# Patient Record
Sex: Female | Born: 1985 | Race: Black or African American | Hispanic: No | Marital: Single | Smoking: Never smoker
Health system: Southern US, Community
[De-identification: ages and names within clinical notes are randomized; demographics above are authoritative.]

## PROBLEM LIST (undated history)

## (undated) HISTORY — PX: WRIST SURGERY: SHX841

---

## 2017-11-30 ENCOUNTER — Encounter: Payer: Self-pay | Admitting: Emergency Medicine

## 2017-11-30 ENCOUNTER — Other Ambulatory Visit: Payer: Self-pay

## 2017-11-30 ENCOUNTER — Ambulatory Visit
Admission: EM | Admit: 2017-11-30 | Discharge: 2017-11-30 | Disposition: A | Discharge status: Home / Self Care | Payer: BLUE CROSS/BLUE SHIELD | Attending: Family Medicine | Admitting: Family Medicine

## 2017-11-30 ENCOUNTER — Ambulatory Visit (INDEPENDENT_AMBULATORY_CARE_PROVIDER_SITE_OTHER): Payer: BLUE CROSS/BLUE SHIELD

## 2017-11-30 DIAGNOSIS — M79644 Pain in right finger(s): Secondary | ICD-10-CM | POA: Diagnosis not present

## 2017-11-30 MED ORDER — MELOXICAM 15 MG PO TABS: 15.0000 mg | ORAL_TABLET | Freq: Every day | ORAL | 0 refills | Status: DC | PRN

## 2017-11-30 NOTE — Discharge Instructions (Addendum)
Take medication as prescribed. Keep in splint.   Follow up with orthopedic this week.   Follow up with your primary care physician this week as needed. Return to Urgent care for new or worsening concerns.

## 2017-11-30 NOTE — ED Provider Notes (Signed)
[facility] ____________________________________________  Time seen: Approximately 7:09 PM  I have reviewed the triage vital signs and the nursing notes.   HISTORY  Chief Complaint Finger Pain (Right 4th finger)  HPI Susan Rodgers is a 32 y.o. female patient for evaluation of right middle finger pain and swelling that she noticed upon awakening this morning.  States last night finger felt completely normal.  Denies any known injury, trauma or potential injury.  States she does sleep with her hand underneath her pillow but again denies injury.  Denies insect bite, skin changes.  Reports healthy person.  Denies chronic medical problems.  Reports right-hand dominant.  Denies history of similar in the past.  Reports up-to-date on immunizations including tetanus.  Denies pain radiation, paresthesias or loss of range of motion.  States the pain is minimal if none, if hand is completely still, states pain is only with fourth finger flexion. Denies recent sickness. Denies recent antibiotic use.   [name], Anita, DO:PCP No LMP recorded. (Menstrual status: IUD).denies pregnancy.   History reviewed. No pertinent past medical history.  There are no active problems to display for this patient.   History reviewed. No pertinent surgical history.   No current facility-administered medications for this encounter.   Current Outpatient Medications:  .  levonorgestrel (MIRENA) 20 MCG/24HR IUD, 1 each by Intrauterine route once., Disp: , Rfl:  .  meloxicam (MOBIC) 15 MG tablet, Take 1 tablet (15 mg total) by mouth daily as needed., Disp: 10 tablet, Rfl: 0  Allergies Patient has no known allergies.  History reviewed. No pertinent family history.  Social History Social History   Tobacco Use  . Smoking status: Never Smoker  . Smokeless tobacco: Never Used  Substance Use Topics  . Alcohol use: Yes  . Drug use: Never    Review of Systems Constitutional: No  fever/chills Cardiovascular: Denies chest pain. Respiratory: Denies shortness of breath. Gastrointestinal: No abdominal pain.   Musculoskeletal: Negative for back pain. As above.  Skin: Negative for rash.  ____________________________________________   PHYSICAL EXAM:  VITAL SIGNS: ED Triage Vitals  Enc Vitals Group     BP 11/30/17 1826 121/74     Pulse Rate 11/30/17 1826 75     Resp 11/30/17 1826 14     Temp 11/30/17 1826 98.6 F (37 C)     Temp Source 11/30/17 1826 Oral     SpO2 11/30/17 1826 100 %     Weight 11/30/17 1823 131 lb (59.4 kg)     Height 11/30/17 1823 5\' 6"  (1.676 m)     Head Circumference --      Peak Flow --      Pain Score 11/30/17 1823 5     Pain Loc --      Pain Edu? --      Excl. in GC? --     Constitutional: Alert and oriented. Well appearing and in no acute distress. ENT      Head: Normocephalic and atraumatic. Cardiovascular: Normal rate, regular rhythm. Grossly normal heart sounds.  Good peripheral circulation. Respiratory: Normal respiratory effort without tachypnea nor retractions. Breath sounds are clear and equal bilaterally. No wheezes, rales, rhonchi. Musculoskeletal:  Steady gait.  Bilateral distal radial pulses equal and easily palpated.  Bilateral hand grip strong and equal.  Except: Right hand medial aspect of the PIP joint mild tenderness to direct palpation with noted soft tissue swelling, no erythema, no break in skin, minimal pain with resisted distal flexion and extension of fourth finger,  but full range of motion present, right hand otherwise nontender, normal distal sensation and capillary refill. Neurologic:  Normal speech and language. Speech is normal. No gait instability.  Skin:  Skin is warm, dry and intact. No rash noted. Psychiatric: Mood and affect are normal. Speech and behavior are normal. Patient exhibits appropriate insight and judgment   ___________________________________________   LABS (all labs ordered are listed,  but only abnormal results are displayed)  Labs Reviewed - No data to display ____________________________________________  Radiology EXAM: RIGHT RING FINGER 2+V  COMPARISON:  None.  FINDINGS: No fracture or malalignment. Joint space within normal limits. Swelling at the PIP joint with amorphous calcification adjacent to the PIP joint.  IMPRESSION: 1. No acute fracture or malalignment 2. Nonspecific periarticular calcification at the PIP joint, possibly dystrophic or due to calcific periarthritis. A small calcified periarticular mass could also be considered but no significant soft tissue component.   Electronically Signed   By: Jasmine Pang M.D.   On: 11/30/2017 19:33   PROCEDURES Procedures   INITIAL IMPRESSION / ASSESSMENT AND PLAN / ED COURSE  Pertinent labs & imaging results that were available during my care of the patient were reviewed by me and considered in my medical decision making (see chart for details).  Well-appearing patient.  No acute distress.  Presenting for evaluation of right fourth finger pain and swelling without known trigger.  Right ring finger x-ray performed with above findings, reviewed by myself.  Called and discussed x-ray with Dr. Deeann Saint orthopedic on-call due to concern for atypical splint, buddy tape, finding.  Dr. Hyacinth Meeker recommended oral daily NSAID and follow-up with them this week.  Information given for Dr. Stephenie Acres per Dr. Rondel Baton recommendation.  Encourage rest, ice and monitoring.Discussed indication, risks and benefits of medications with patient.   Discussed follow up and return parameters including no resolution or any worsening concerns. Patient verbalized understanding and agreed to plan.   ____________________________________________   FINAL CLINICAL IMPRESSION(S) / ED DIAGNOSES  Final diagnoses:  Finger pain, right     ED Discharge Orders        Ordered    meloxicam (MOBIC) 15 MG tablet  Daily PRN      11/30/17 1952       Note: This dictation was prepared with Dragon dictation along with smaller phrase technology. Any transcriptional errors that result from this process are unintentional.         Renford Dills, NP 11/30/17 2028

## 2017-11-30 NOTE — ED Triage Notes (Signed)
Patient states that she woke up this morning with pain and swelling in her right 4th finger.

## 2018-07-07 ENCOUNTER — Encounter: Payer: Self-pay | Admitting: Emergency Medicine

## 2018-07-07 ENCOUNTER — Other Ambulatory Visit: Payer: Self-pay

## 2018-07-07 ENCOUNTER — Ambulatory Visit
Admission: EM | Admit: 2018-07-07 | Discharge: 2018-07-07 | Disposition: A | Discharge status: Home / Self Care | Payer: BLUE CROSS/BLUE SHIELD | Attending: Family Medicine | Admitting: Family Medicine

## 2018-07-07 DIAGNOSIS — M542 Cervicalgia: Secondary | ICD-10-CM | POA: Diagnosis not present

## 2018-07-07 DIAGNOSIS — R51 Headache: Secondary | ICD-10-CM | POA: Insufficient documentation

## 2018-07-07 DIAGNOSIS — R519 Headache, unspecified: Secondary | ICD-10-CM

## 2018-07-07 MED ORDER — DICLOFENAC SODIUM 75 MG PO TBEC: 75.0000 mg | DELAYED_RELEASE_TABLET | Freq: Two times a day (BID) | ORAL | 0 refills | Status: DC | PRN

## 2018-07-07 MED ORDER — TIZANIDINE HCL 4 MG PO TABS: 4.0000 mg | ORAL_TABLET | Freq: Three times a day (TID) | ORAL | 0 refills | Status: DC | PRN
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
## 2018-07-07 NOTE — Discharge Instructions (Signed)
Ice and heat as needed.  Medication as needed.  Take care  Dr. Adriana Simasook
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
## 2020-08-28 ENCOUNTER — Ambulatory Visit: Admit: 2020-08-28 | Discharge status: Against Medical Advice | Payer: 59

## 2020-09-05 ENCOUNTER — Encounter: Payer: Self-pay | Admitting: Emergency Medicine

## 2020-09-05 ENCOUNTER — Emergency Department: Payer: 59

## 2020-09-05 ENCOUNTER — Other Ambulatory Visit: Payer: Self-pay

## 2020-09-05 ENCOUNTER — Emergency Department
Admission: EM | Admit: 2020-09-05 | Discharge: 2020-09-05 | Disposition: A | Discharge status: Home / Self Care | Payer: 59 | Attending: Emergency Medicine | Admitting: Emergency Medicine

## 2020-09-05 DIAGNOSIS — U071 COVID-19: Secondary | ICD-10-CM | POA: Insufficient documentation

## 2020-09-05 DIAGNOSIS — R519 Headache, unspecified: Secondary | ICD-10-CM | POA: Diagnosis present

## 2020-09-05 DIAGNOSIS — Z20822 Contact with and (suspected) exposure to covid-19: Secondary | ICD-10-CM

## 2020-09-05 DIAGNOSIS — Z1152 Encounter for screening for COVID-19: Secondary | ICD-10-CM

## 2020-09-05 LAB — SARS CORONAVIRUS 2 (TAT 6-24 HRS): SARS Coronavirus 2: POSITIVE — AB

## 2020-09-05 NOTE — Discharge Instructions (Signed)
QUARANTINE INSTRUCTION  Follow these instructions at home:  Protecting others To avoid spreading the illness to other people: Quarantine in your home until you have had no cough and fever for 7 days. Household members should also be quarantine for at least 14 days after being exposed to you. Wash your hands often with soap and water. If soap and water are not available, use an alcohol-based hand sanitizer. If you have not cleaned your hands, do not touch your face. Make sure that all people in your household wash their hands well and often. Cover your nose and mouth when you cough or sneeze. Throw away used tissues. Stay home if you have any cold-like or flu-like symptoms. General instructions Go to your local pharmacy and buy a pulse oximeter (this is a machine that measures your oxygen). Check your oxygen levels at least 3 times a day. If your oxygen level is 90% or less return to the emergency room immediately Take over-the-counter and prescription medicines only as told by your health care provider. If you need medication for fever take tylenol or ibuprofen Drink enough fluid to keep your urine pale yellow. Rest at home as directed by your health care provider. Do not give aspirin to a child with the flu, because of the association with Reye's syndrome. Do not use tobacco products, including cigarettes, chewing tobacco, and e-cigarettes. If you need help quitting, ask your health care provider. Keep all follow-up visits as told by your health care provider. This is important. How is this prevented? Avoid areas where an outbreak has been reported. Avoid large groups of people. Keep a safe distance from people who are coughing and sneezing. Do not touch your face if you have not cleaned your hands. When you are around people who are sick or might be sick, wear a mask to protect yourself. Contact a health care provider if: You have symptoms of SARS (cough, fever, chest pain, shortness of  breath) that are not getting better at home. You have a fever. If you have difficulty breathing go to your local ER or call 911    [facility]- [facility]  [phone]  

## 2020-09-05 NOTE — ED Triage Notes (Signed)
Pt to ED from home c/o headache that started yesterday, loss of taste today, cough and runny nose since last Wednesday.  Took rapid COVID test last week was negative and PCR test but never received results.  Pt A&Ox4, chest rise even and unlabored, skin WNL and in NAD at this time.

## 2020-09-05 NOTE — ED Provider Notes (Addendum)
[facility] Emergency Department Provider Note  ____________________________________________  Time seen: Approximately 2:46 AM  I have reviewed the triage vital signs and the nursing notes.   HISTORY  Chief Complaint Headache, Cough, and Nasal Congestion   HPI Susan Rodgers is a 35 y.o. female with no significant past medical history who presents for evaluation of COVID-like symptoms.  Patient reports that her symptoms started a week ago with headache, loss of taste, cough, congestion, diarrhea.  She had a rapid COVID test last week which was negative.  No chest pain or shortness of breath, no abdominal pain, no nausea or vomiting. Patient requesting Covid PCR test. Patient is vaccinated  History reviewed. No pertinent past medical history.  There are no problems to display for this patient.   Past Surgical History:  Procedure Laterality Date  . WRIST SURGERY      Prior to Admission medications   Medication Sig Start Date End Date Taking? Authorizing Provider  diclofenac (VOLTAREN) 75 MG EC tablet Take 1 tablet (75 mg total) by mouth 2 (two) times daily as needed. 07/07/18   Tommie Sams, DO  levonorgestrel (MIRENA) 20 MCG/24HR IUD 1 each by Intrauterine route once.    [provider]  tiZANidine (ZANAFLEX) 4 MG tablet Take 1 tablet (4 mg total) by mouth every 8 (eight) hours as needed for muscle spasms. 07/07/18   Tommie Sams, DO    Allergies Patient has no known allergies.  History reviewed. No pertinent family history.  Social History Social History   Tobacco Use  . Smoking status: Never Smoker  . Smokeless tobacco: Never Used  Vaping Use  . Vaping Use: Never used  Substance Use Topics  . Alcohol use: Yes  . Drug use: Never    Review of Systems  Constitutional: Negative for fever. Eyes: Negative for visual changes. ENT: Negative for sore throat. + congestion Neck: No neck pain  Cardiovascular: Negative for chest  pain. Respiratory: Negative for shortness of breath. + cough Gastrointestinal: Negative for abdominal pain, vomiting. + diarrhea. Genitourinary: Negative for dysuria. Musculoskeletal: Negative for back pain. Skin: Negative for rash. Neurological: Negative for  weakness or numbness. + HA Psych: No SI or HI  ____________________________________________   PHYSICAL EXAM:  VITAL SIGNS: ED Triage Vitals  Enc Vitals Group     BP 09/05/20 0132 132/89     Pulse Rate 09/05/20 0132 75     Resp 09/05/20 0132 16     Temp 09/05/20 0132 98.2 F (36.8 C)     Temp Source 09/05/20 0132 Oral     SpO2 09/05/20 0132 98 %     Weight 09/05/20 0133 135 lb (61.2 kg)     Height 09/05/20 0133 5\' 6"  (1.676 m)     Head Circumference --      Peak Flow --      Pain Score 09/05/20 0132 4     Pain Loc --      Pain Edu? --      Excl. in GC? --     Constitutional: Alert and oriented. Well appearing and in no apparent distress. HEENT:      Head: Normocephalic and atraumatic.         Eyes: Conjunctivae are normal. Sclera is non-icteric.       Mouth/Throat: Mucous membranes are moist.       Neck: Supple with no signs of meningismus. Cardiovascular: Regular rate and rhythm. No murmurs, gallops, or rubs. 2+ symmetrical distal pulses are  present in all extremities. No JVD. Respiratory: Normal respiratory effort. Lungs are clear to auscultation bilaterally. No wheezes, crackles, or rhonchi.  Gastrointestinal: Soft, non tender. Musculoskeletal: No edema, cyanosis, or erythema of extremities. Neurologic: Normal speech and language. Face is symmetric. Moving all extremities. No gross focal neurologic deficits are appreciated. Skin: Skin is warm, dry and intact. No rash noted. Psychiatric: Mood and affect are normal. Speech and behavior are normal.  ____________________________________________   LABS (all labs ordered are listed, but only abnormal results are displayed)  Labs Reviewed  SARS CORONAVIRUS 2  (TAT 6-24 HRS)   ____________________________________________  EKG  none  ____________________________________________  RADIOLOGY  I have personally reviewed the images performed during this visit and I agree with the Radiologist's read.  CLINICAL DATA: Cough, headache, loss of taste, initial COVID testing negative, never received subsequent PCR results  EXAM: CHEST - 2 VIEW  COMPARISON: None.  FINDINGS: No consolidation, features of edema, pneumothorax, or effusion. Pulmonary vascularity is normally distributed. The cardiomediastinal contours are unremarkable. No acute osseous or soft tissue abnormality.  IMPRESSION: No acute cardiopulmonary abnormality.   Electronically Signed By: Kreg Shropshire M.D. On: 09/05/2020 01:55   ____________________________________________   PROCEDURES  Procedure(s) performed: None Procedures Critical Care performed:  None ____________________________________________   INITIAL IMPRESSION / ASSESSMENT AND PLAN / ED COURSE  35 y.o. female with no significant past medical history who presents for evaluation of COVID-like symptoms.  Patient is extremely well-appearing in no distress with normal vitals, normal work of breathing, normal sats both at rest and with ambulation.  Chest x-ray visualized by me with no signs of multifocal pneumonia, confirmed by radiology.  Patient with suspected COVID infection.  COVID swab has been sent.  Discussed quarantine, pulse oximeter monitoring at home, symptom relief, and post-COVID follow-up if test is positive.  Discussed my standard return precautions.       _____________________________________________ Please note:  Patient was evaluated in Emergency Department today for the symptoms described in the history of present illness. Patient was evaluated in the context of the global COVID-19 pandemic, which necessitated consideration that the patient might be at risk for infection with the SARS-CoV-2  virus that causes COVID-19. Institutional protocols and algorithms that pertain to the evaluation of patients at risk for COVID-19 are in a state of rapid change based on information released by regulatory bodies including the CDC and federal and state organizations. These policies and algorithms were followed during the patient's care in the ED.  Some ED evaluations and interventions may be delayed as a result of limited staffing during the pandemic.   [place] Controlled Substance Database was reviewed by me. ____________________________________________   FINAL CLINICAL IMPRESSION(S) / ED DIAGNOSES   Final diagnoses:  Encounter for screening for COVID-19  Suspected COVID-19 virus infection      NEW MEDICATIONS STARTED DURING THIS VISIT:  ED Discharge Orders    None       Note:  This document was prepared using Dragon voice recognition software and may include unintentional dictation errors.    Don Perking, [place], MD 09/05/20 9798    Nita Sickle, MD 09/05/20 0300    Nita Sickle, MD 09/05/20 [phone]

## 2021-01-13 ENCOUNTER — Ambulatory Visit
Admission: RE | Admit: 2021-01-13 | Discharge: 2021-01-13 | Disposition: A | Discharge status: Home / Self Care | Payer: 59 | Source: Ambulatory Visit | Attending: Emergency Medicine | Admitting: Emergency Medicine

## 2021-01-13 ENCOUNTER — Other Ambulatory Visit: Payer: Self-pay

## 2021-01-13 VITALS — BP 130/91 | HR 103 | Temp 99.8°F | Resp 18 | Ht 66.0 in | Wt 140.0 lb

## 2021-01-13 DIAGNOSIS — J069 Acute upper respiratory infection, unspecified: Secondary | ICD-10-CM | POA: Diagnosis not present

## 2021-01-13 LAB — RAPID INFLUENZA A&B ANTIGENS
Influenza A (ARMC): NEGATIVE
Influenza B (ARMC): NEGATIVE

## 2021-01-13 MED ORDER — PROMETHAZINE-DM 6.25-15 MG/5ML PO SYRP: 5.0000 mL | ORAL_SOLUTION | Freq: Four times a day (QID) | ORAL | 0 refills | Status: AC | PRN

## 2021-01-13 MED ORDER — IPRATROPIUM BROMIDE 0.06 % NA SOLN: 2.0000 | Freq: Four times a day (QID) | NASAL | 12 refills | Status: DC

## 2021-01-13 MED ORDER — BENZONATATE 100 MG PO CAPS: 200.0000 mg | ORAL_CAPSULE | Freq: Three times a day (TID) | ORAL | 0 refills | Status: DC

## 2021-01-13 NOTE — Discharge Instructions (Addendum)

## 2021-01-13 NOTE — ED Provider Notes (Signed)
[facility]    CSN: [id number] Arrival date & time: 01/13/21  1054      History   Chief Complaint Chief Complaint  Patient presents with  . Cough    HPI Susan Rodgers is a 35 y.o. female.   HPI   35 year old female here for evaluation of cough, nasal congestion, chest congestion, and body aches.  Patient reports that she has had symptoms for the last 2 days.  Her daughter was diagnosed with flu 2 days ago.  Patient reports that she has had a nonproductive cough, scratchy throat, and fatigue in addition to the above symptoms.  She denies fever, ear pain, nasal discharge, shortness breath or wheezing, or GI complaints.  History reviewed. No pertinent past medical history.  There are no problems to display for this patient.   Past Surgical History:  Procedure Laterality Date  . WRIST SURGERY      OB History   No obstetric history on file.      Home Medications    Prior to Admission medications   Medication Sig Start Date End Date Taking? Authorizing Provider  benzonatate (TESSALON) 100 MG capsule Take 2 capsules (200 mg total) by mouth every 8 (eight) hours. 01/13/21  Yes Becky Augusta, NP  ipratropium (ATROVENT) 0.06 % nasal spray Place 2 sprays into both nostrils 4 (four) times daily. 01/13/21  Yes Becky Augusta, NP  levonorgestrel (MIRENA) 20 MCG/24HR IUD 1 each by Intrauterine route once.   Yes [provider]  promethazine-dextromethorphan (PROMETHAZINE-DM) 6.25-15 MG/5ML syrup Take 5 mLs by mouth 4 (four) times daily as needed. 01/13/21  Yes Becky Augusta, NP    Family History History reviewed. No pertinent family history.  Social History Social History   Tobacco Use  . Smoking status: Never Smoker  . Smokeless tobacco: Never Used  Vaping Use  . Vaping Use: Never used  Substance Use Topics  . Alcohol use: Yes  . Drug use: Never     Allergies   Patient has no known allergies.   Review of Systems Review of Systems   Constitutional: Positive for fatigue. Negative for activity change, appetite change and fever.  HENT: Positive for congestion and sore throat. Negative for ear pain and rhinorrhea.   Respiratory: Positive for cough. Negative for shortness of breath and wheezing.   Gastrointestinal: Negative for diarrhea, nausea and vomiting.  Musculoskeletal: Positive for arthralgias and myalgias.  Hematological: Negative.   Psychiatric/Behavioral: Negative.      Physical Exam Triage Vital Signs ED Triage Vitals  Enc Vitals Group     BP 01/13/21 1137 (!) 130/91     Pulse Rate 01/13/21 1137 (!) 103     Resp 01/13/21 1137 18     Temp 01/13/21 1137 99.8 F (37.7 C)     Temp Source 01/13/21 1137 Oral     SpO2 01/13/21 1137 99 %     Weight 01/13/21 1134 140 lb (63.5 kg)     Height 01/13/21 1134 5\' 6"  (1.676 m)     Head Circumference --      Peak Flow --      Pain Score 01/13/21 1134 6     Pain Loc --      Pain Edu? --      Excl. in GC? --    No data found.  Updated Vital Signs BP (!) 130/91 (BP Location: Left Arm)   Pulse (!) 103   Temp 99.8 F (37.7 C) (Oral)   Resp 18   Ht  5\' 6"  (1.676 m)   Wt 140 lb (63.5 kg)   SpO2 99%   BMI 22.60 kg/m   Visual Acuity Right Eye Distance:   Left Eye Distance:   Bilateral Distance:    Right Eye Near:   Left Eye Near:    Bilateral Near:     Physical Exam Vitals and nursing note reviewed.  Constitutional:      General: She is not in acute distress.    Appearance: Normal appearance. She is normal weight. She is not ill-appearing.  HENT:     Head: Normocephalic and atraumatic.     Right Ear: Tympanic membrane, ear canal and external ear normal. There is no impacted cerumen.     Left Ear: Tympanic membrane, ear canal and external ear normal. There is no impacted cerumen.     Nose: Congestion present.     Mouth/Throat:     Mouth: Mucous membranes are moist.     Pharynx: Oropharynx is clear.  Cardiovascular:     Rate and Rhythm: Normal rate  and regular rhythm.     Pulses: Normal pulses.     Heart sounds: Normal heart sounds. No murmur heard. No gallop.   Pulmonary:     Effort: Pulmonary effort is normal.     Breath sounds: Normal breath sounds. No wheezing, rhonchi or rales.  Musculoskeletal:     Cervical back: Normal range of motion and neck supple.  Lymphadenopathy:     Cervical: No cervical adenopathy.  Skin:    General: Skin is warm and dry.     Capillary Refill: Capillary refill takes less than 2 seconds.     Findings: No erythema or rash.  Neurological:     General: No focal deficit present.     Mental Status: She is alert and oriented to person, place, and time.  Psychiatric:        Mood and Affect: Mood normal.        Behavior: Behavior normal.        Thought Content: Thought content normal.        Judgment: Judgment normal.      UC Treatments / Results  Labs (all labs ordered are listed, but only abnormal results are displayed) Labs Reviewed  RAPID INFLUENZA A&B ANTIGENS    EKG   Radiology No results found.  Procedures Procedures (including critical care time)  Medications Ordered in UC Medications - No data to display  Initial Impression / Assessment and Plan / UC Course  I have reviewed the triage vital signs and the nursing notes.  Pertinent labs & imaging results that were available during my care of the patient were reviewed by me and considered in my medical decision making (see chart for details).   Patient is a very pleasant, nontoxic-appearing 35 year old female here for evaluation of respiratory complaints of been going on for last 2 days.  She denies any fever, ear pain or pressure, nasal discharge, shortness breath or wheezing, GI complaints.  Physical exam reveals pearly gray tympanic membranes bilaterally with a normal light reflex.  The left external auditory canal is mildly ceruminous but the right is clear.  Nasal mucosa is erythematous and edematous with minimal thick clear  nasal discharge.  Oropharyngeal exam reveals posterior oropharyngeal erythema and cobblestoning with clear postnasal drip.  No cervical lymphadenopathy appreciated exam.  Cardiopulmonary exam is benign.  Patient's daughter is flu positive so will swab patient for flu.  Patient is not vaccinated.  Patient is negative for influenza  A or B.  Will discharge patient home with a diagnosis of viral URI and treat with ipratropium nasal spray, Tessalon Perles, and Promethazine DM cough syrup.   Final Clinical Impressions(s) / UC Diagnoses   Final diagnoses:  Viral URI with cough     Discharge Instructions     Use the Atrovent nasal spray, 2 squirts in each nostril every 6 hours, as needed for runny nose and postnasal drip.  Use the Tessalon Perles every 8 hours during the day.  Take them with a small sip of water.  They may give you some numbness to the base of your tongue or a metallic taste in your mouth, this is normal.  Use the Promethazine DM cough syrup at bedtime for cough and congestion.  It will make you drowsy so do not take it during the day.  Return for reevaluation or see your primary care provider for any new or worsening symptoms.     ED Prescriptions    Medication Sig Dispense Auth. Provider   ipratropium (ATROVENT) 0.06 % nasal spray Place 2 sprays into both nostrils 4 (four) times daily. 15 mL Becky Augusta, NP   benzonatate (TESSALON) 100 MG capsule Take 2 capsules (200 mg total) by mouth every 8 (eight) hours. 21 capsule Becky Augusta, NP   promethazine-dextromethorphan (PROMETHAZINE-DM) 6.25-15 MG/5ML syrup Take 5 mLs by mouth 4 (four) times daily as needed. 118 mL Becky Augusta, NP     PDMP not reviewed this encounter.   Becky Augusta, NP 01/13/21 1217

## 2021-01-13 NOTE — ED Triage Notes (Signed)
Patient complains of cough, congestion, body aches since Saturday. Patient states that her daughter is positive for the flu.

## 2021-06-04 ENCOUNTER — Telehealth: Payer: 59 | Admitting: Nurse Practitioner

## 2021-06-04 ENCOUNTER — Telehealth: Payer: 59

## 2021-06-04 DIAGNOSIS — R399 Unspecified symptoms and signs involving the genitourinary system: Secondary | ICD-10-CM | POA: Diagnosis not present

## 2021-06-04 MED ORDER — NITROFURANTOIN MONOHYD MACRO 100 MG PO CAPS: 100.0000 mg | ORAL_CAPSULE | Freq: Two times a day (BID) | ORAL | 0 refills | Status: DC

## 2021-06-04 NOTE — Progress Notes (Signed)
Virtual Visit Consent   Susan Rodgers, you are scheduled for a virtual visit with Susan Daphine Deutscher, FNP, a [facility] provider, today.     Just as with appointments in the office, your consent must be obtained to participate.  Your consent will be active for this visit and any virtual visit you may have with one of our providers in the next 365 days.     If you have a MyChart account, a copy of this consent can be sent to you electronically.  All virtual visits are billed to your insurance company just like a traditional visit in the office.    As this is a virtual visit, video technology does not allow for your provider to perform a traditional examination.  This may limit your provider's ability to fully assess your condition.  If your provider identifies any concerns that need to be evaluated in person or the need to arrange testing (such as labs, EKG, etc.), we will make arrangements to do so.     Although advances in technology are sophisticated, we cannot ensure that it will always work on either your end or our end.  If the connection with a video visit is poor, the visit may have to be switched to a telephone visit.  With either a video or telephone visit, we are not always able to ensure that we have a secure connection.     I need to obtain your verbal consent now.   Are you willing to proceed with your visit today? YES   Susan Rodgers has provided verbal consent on 06/04/2021 for a virtual visit (video or telephone).   Susan Daphine Deutscher, FNP   Date: 06/04/2021 9:55 AM   Virtual Visit via Video Note   I, Susan Rodgers, connected with Susan Rodgers ([id number], 1986-04-05) on 06/04/21 at 10:00 AM EDT by a video-enabled telemedicine application and verified that I am speaking with the correct person using two identifiers.  Location: Patient: Virtual Visit Location Patient: Home Provider: Virtual Visit Location Provider: Mobile   I discussed the  limitations of evaluation and management by telemedicine and the availability of in person appointments. The patient expressed understanding and agreed to proceed.    History of Present Illness: Susan Rodgers is a 35 y.o. who identifies as a female who was assigned female at birth, and is being seen today for urine strong.  HPI: Patient states that urine is feeling strong. She has no dysuria. Has some frequency and urgency. Denies abdominal pain or back pain.   Problems: There are no problems to display for this patient.   Allergies: No Known Allergies Medications:  Current Outpatient Medications:    benzonatate (TESSALON) 100 MG capsule, Take 2 capsules (200 mg total) by mouth every 8 (eight) hours., Disp: 21 capsule, Rfl: 0   ipratropium (ATROVENT) 0.06 % nasal spray, Place 2 sprays into both nostrils 4 (four) times daily., Disp: 15 mL, Rfl: 12   levonorgestrel (MIRENA) 20 MCG/24HR IUD, 1 each by Intrauterine route once., Disp: , Rfl:    promethazine-dextromethorphan (PROMETHAZINE-DM) 6.25-15 MG/5ML syrup, Take 5 mLs by mouth 4 (four) times daily as needed., Disp: 118 mL, Rfl: 0  Observations/Objective: Patient is well-developed, well-nourished in no acute distress.  Resting comfortably  at home.  Head is normocephalic, atraumatic.  No labored breathing.  Speech is clear and coherent with logical content.  Patient is alert and oriented at baseline.    Assessment and Plan:  Yarethzy Dino in today with chief complaint of  UTI symptoms  1. UTI symptoms Take medication as prescribe Cotton underwear Take shower not bath Cranberry juice, yogurt Force fluids AZO over the counter X2 days RTO prn  Meds ordered this encounter  Medications   nitrofurantoin, macrocrystal-monohydrate, (MACROBID) 100 MG capsule    Sig: Take 1 capsule (100 mg total) by mouth 2 (two) times daily. 1 po BId    Dispense:  10 capsule    Refill:  0    Order Specific Question:   Supervising Provider     Answer:   Eber Hong [[id number]]    Patient Instructions  Asymptomatic Bacteriuria Asymptomatic bacteriuria is the presence of a large number of bacteria in the urine without the usual symptoms of burning or frequent urination. This is usually not harmful, and treatment may not be needed. A person with this condition will not be more likely to develop an infection in the future. What are the causes? This condition is caused by an increase in bacteria in the urine. This increase can be caused by: Bacteria entering the urinary tract, such as during sex. A blockage in the urinary tract, such as from kidney stones or a tumor. Bladder problems that prevent the bladder from emptying. What increases the risk? You are more likely to develop this condition if: You have diabetes. You are an older adult. This especially affects older adults in long-term care facilities. You are pregnant and in the first trimester. You have kidney stones. You are female. You have had a kidney transplant. You have a leaky kidney tube valve (reflux). You had a urinary catheter for a long period of time. This is a long, thin tube that collects urine. What are the signs or symptoms? There are no symptoms of this condition. How is this diagnosed? This condition is diagnosed with a urine test. Because this condition does not cause symptoms, it is usually diagnosed when a urine sample is taken to treat or diagnose another condition, such as pregnancy or kidney problems. Most women who are in their first trimester of pregnancy are screened for asymptomatic bacteriuria. How is this treated? Usually, treatment is not needed for this condition. Treating the condition can lead to other problems, such as a yeast infection or the growth of bacteria that do not respond to treatment (antibiotic-resistant bacteria). Some people do need treatment with antibiotic medicines to prevent kidney infection, known as pyelonephritis. Treatment is  needed if: You are pregnant. In pregnant women, kidney infection can lead to: Early labor (premature labor). Very low birth weight (fetal growth restriction). Newborn death. You are having a procedure that affects the urinary tract. You have had a kidney transplant. If you are diagnosed with this condition, talk with your health care provider about any concerns that you have. Follow these instructions at home: Medicines Take over-the-counter and prescription medicines only as told by your health care provider. If you were prescribed an antibiotic medicine, take it as told by your health care provider. Do not stop using the antibiotic even if you start to feel better. General instructions Monitor your condition for any changes. Drink enough fluid to keep your urine pale yellow. Urinate more often to keep your bladder empty. If you are female, keep the area around your vagina and rectum clean. Wipe from front to back after urinating or having a bowel movement. Use each piece of toilet paper only once. Keep all follow-up visits. This is important. Contact a health care provider if: You have symptoms of a urine infection, such  as: A burning sensation, or pain when you urinate. A strong need to urinate, or urinating more often. Urine turning discolored or cloudy. Blood in your urine. Urine that smells bad. Get help right away if: You develop signs of a kidney infection, such as: Back pain or pelvic pain. A fever or chills. Nausea or vomiting. Severe pain that cannot be controlled with medicine. Summary Asymptomatic bacteriuria is the presence of a large number of bacteria in the urine without the usual symptoms of burning or frequent urination. Usually, treatment is not needed for this condition. Treating the condition can lead to other problems, such as a yeast infection or the growth of bacteria that do not respond to treatment. Some people do need treatment. Treatment is needed if you  are pregnant, if you are having a procedure that affects the urinary tract, or if you have had a kidney transplant. If you were prescribed an antibiotic medicine, take it as told by your health care provider. Do not stop using the antibiotic even if you start to feel better. This information is not intended to replace advice given to you by your health care provider. Make sure you discuss any questions you have with your health care provider. Document Revised: 03/15/2020 Document Reviewed: 03/15/2020 Elsevier Patient Education  2022 [employer]    Follow Up Instructions: I discussed the assessment and treatment plan with the patient. The patient was provided an opportunity to ask questions and all were answered. The patient agreed with the plan and demonstrated an understanding of the instructions.  A copy of instructions were sent to the patient via MyChart.  The patient was advised to call back or seek an in-person evaluation if the symptoms worsen or if the condition fails to improve as anticipated.  Time:  I spent 10 minutes with the patient via telehealth technology discussing the above problems/concerns.    Susan Daphine Deutscher, FNP

## 2021-06-04 NOTE — Patient Instructions (Signed)
Asymptomatic Bacteriuria Asymptomatic bacteriuria is the presence of a large number of bacteria in the urine without the usual symptoms of burning or frequent urination. This is usually not harmful, and treatment may not be needed. A person with this condition will not be more likely to develop an infection in the future. What are the causes? This condition is caused by an increase in bacteria in the urine. This increase can be caused by: Bacteria entering the urinary tract, such as during sex. A blockage in the urinary tract, such as from kidney stones or a tumor. Bladder problems that prevent the bladder from emptying. What increases the risk? You are more likely to develop this condition if: You have diabetes. You are an older adult. This especially affects older adults in long-term care facilities. You are pregnant and in the first trimester. You have kidney stones. You are female. You have had a kidney transplant. You have a leaky kidney tube valve (reflux). You had a urinary catheter for a long period of time. This is a long, thin tube that collects urine. What are the signs or symptoms? There are no symptoms of this condition. How is this diagnosed? This condition is diagnosed with a urine test. Because this condition does not cause symptoms, it is usually diagnosed when a urine sample is taken to treat or diagnose another condition, such as pregnancy or kidney problems. Most women who are in their first trimester of pregnancy are screened for asymptomatic bacteriuria. How is this treated? Usually, treatment is not needed for this condition. Treating the condition can lead to other problems, such as a yeast infection or the growth of bacteria that do not respond to treatment (antibiotic-resistant bacteria). Some people do need treatment with antibiotic medicines to prevent kidney infection, known as pyelonephritis. Treatment is needed if: You are pregnant. In pregnant women, kidney  infection can lead to: Early labor (premature labor). Very low birth weight (fetal growth restriction). Newborn death. You are having a procedure that affects the urinary tract. You have had a kidney transplant. If you are diagnosed with this condition, talk with your health care provider about any concerns that you have. Follow these instructions at home: Medicines Take over-the-counter and prescription medicines only as told by your health care provider. If you were prescribed an antibiotic medicine, take it as told by your health care provider. Do not stop using the antibiotic even if you start to feel better. General instructions Monitor your condition for any changes. Drink enough fluid to keep your urine pale yellow. Urinate more often to keep your bladder empty. If you are female, keep the area around your vagina and rectum clean. Wipe from front to back after urinating or having a bowel movement. Use each piece of toilet paper only once. Keep all follow-up visits. This is important. Contact a health care provider if: You have symptoms of a urine infection, such as: A burning sensation, or pain when you urinate. A strong need to urinate, or urinating more often. Urine turning discolored or cloudy. Blood in your urine. Urine that smells bad. Get help right away if: You develop signs of a kidney infection, such as: Back pain or pelvic pain. A fever or chills. Nausea or vomiting. Severe pain that cannot be controlled with medicine. Summary Asymptomatic bacteriuria is the presence of a large number of bacteria in the urine without the usual symptoms of burning or frequent urination. Usually, treatment is not needed for this condition. Treating the condition can lead   to other problems, such as a yeast infection or the growth of bacteria that do not respond to treatment. Some people do need treatment. Treatment is needed if you are pregnant, if you are having a procedure that  affects the urinary tract, or if you have had a kidney transplant. If you were prescribed an antibiotic medicine, take it as told by your health care provider. Do not stop using the antibiotic even if you start to feel better. This information is not intended to replace advice given to you by your health care provider. Make sure you discuss any questions you have with your health care provider. Document Revised: 03/15/2020 Document Reviewed: 03/15/2020 Elsevier Patient Education  2022 [employer]  

## 2022-03-06 ENCOUNTER — Ambulatory Visit
Admission: RE | Admit: 2022-03-06 | Discharge: 2022-03-06 | Disposition: A | Discharge status: Home / Self Care | Payer: 59 | Source: Ambulatory Visit | Attending: Physician Assistant | Admitting: Physician Assistant

## 2022-03-06 ENCOUNTER — Other Ambulatory Visit: Payer: Self-pay

## 2022-03-06 VITALS — BP 122/84 | HR 80 | Temp 97.8°F | Resp 16 | Ht 66.0 in | Wt 140.0 lb

## 2022-03-06 DIAGNOSIS — N760 Acute vaginitis: Secondary | ICD-10-CM | POA: Diagnosis not present

## 2022-03-06 DIAGNOSIS — N390 Urinary tract infection, site not specified: Secondary | ICD-10-CM | POA: Diagnosis present

## 2022-03-06 DIAGNOSIS — B3731 Acute candidiasis of vulva and vagina: Secondary | ICD-10-CM | POA: Insufficient documentation

## 2022-03-06 DIAGNOSIS — R319 Hematuria, unspecified: Secondary | ICD-10-CM

## 2022-03-06 LAB — URINALYSIS, ROUTINE W REFLEX MICROSCOPIC
Bilirubin Urine: NEGATIVE
Glucose, UA: NEGATIVE mg/dL
Ketones, ur: NEGATIVE mg/dL
Nitrite: NEGATIVE
Specific Gravity, Urine: 1.015 (ref 1.005–1.030)
pH: 6.5 (ref 5.0–8.0)

## 2022-03-06 LAB — URINALYSIS, MICROSCOPIC (REFLEX): WBC, UA: >50 WBC/hpf (ref 0–5)

## 2022-03-06 MED ORDER — CEPHALEXIN 500 MG PO CAPS: 500.0000 mg | ORAL_CAPSULE | Freq: Two times a day (BID) | ORAL | 0 refills | Status: AC

## 2022-03-06 NOTE — ED Provider Notes (Signed)
[facility]    CSN: [id number] Arrival date & time: 03/06/22  3149      History   Chief Complaint Chief Complaint  Patient presents with   Dysuria    HPI Susan Rodgers is a 36 y.o. female.   HPI  Ms. Fulco is in today for a follow up. She reports being treated virtually for a UTI. She has completed her treatment 5 days ago. She has bee having 3 days of odor and abnormal sensation with urination. She has pressure. She did have a UTI in October. She reports having a IUD that is over 5 years. She has as apt with her GYN next month for exchange.   Denies any pelvic pain or tenderness, amenorrhea irregular bleeding or prolonged heavy bleeding.  Denies vaginal discharge nausea, vomiting, polyuria,   Denies ulcers or lesions    History reviewed. No pertinent past medical history.  There are no problems to display for this patient.   Past Surgical History:  Procedure Laterality Date   WRIST SURGERY      OB History   No obstetric history on file.      Home Medications    Prior to Admission medications   Medication Sig Start Date End Date Taking? Authorizing Provider  levonorgestrel (MIRENA) 20 MCG/24HR IUD 1 each by Intrauterine route once.   Yes [provider]  cephALEXin (KEFLEX) 500 MG capsule Take 1 capsule (500 mg total) by mouth 2 (two) times daily for 7 days. 03/06/22 03/13/22 Yes Barbette Merino, NP  promethazine-dextromethorphan (PROMETHAZINE-DM) 6.25-15 MG/5ML syrup Take 5 mLs by mouth 4 (four) times daily as needed. 01/13/21   Becky Augusta, NP    Family History No family history on file.  Social History Social History   Tobacco Use   Smoking status: Never   Smokeless tobacco: Never  Vaping Use   Vaping Use: Never used  Substance Use Topics   Alcohol use: Yes   Drug use: Never     Allergies   Sulfa antibiotics   Review of Systems Review of Systems   Physical Exam Triage Vital Signs ED Triage Vitals  Enc Vitals  Group     BP 03/06/22 0945 122/84     Pulse Rate 03/06/22 0945 80     Resp 03/06/22 0945 16     Temp 03/06/22 0945 97.8 F (36.6 C)     Temp Source 03/06/22 0945 Oral     SpO2 03/06/22 0945 100 %     Weight 03/06/22 0944 139 lb 15.9 oz (63.5 kg)     Height 03/06/22 0944 5\' 6"  (1.676 m)     Head Circumference --      Peak Flow --      Pain Score 03/06/22 0943 0     Pain Loc --      Pain Edu? --      Excl. in GC? --    No data found.  Updated Vital Signs BP 122/84 (BP Location: Left Arm)   Pulse 80   Temp 97.8 F (36.6 C) (Oral)   Resp 16   Ht 5\' 6"  (1.676 m)   Wt 139 lb 15.9 oz (63.5 kg)   SpO2 100%   BMI 22.60 kg/m   Visual Acuity Right Eye Distance:   Left Eye Distance:   Bilateral Distance:    Right Eye Near:   Left Eye Near:    Bilateral Near:     Physical Exam Constitutional:      General:  She is not in acute distress.    Appearance: She is not ill-appearing, toxic-appearing or diaphoretic.  HENT:     Head: Normocephalic.     Nose: Nose normal.     Mouth/Throat:     Mouth: Mucous membranes are moist.  Cardiovascular:     Rate and Rhythm: Normal rate.     Pulses: Normal pulses.  Pulmonary:     Effort: Pulmonary effort is normal.  Musculoskeletal:     Cervical back: Normal range of motion.  Skin:    General: Skin is warm.  Neurological:     General: No focal deficit present.     Mental Status: She is alert and oriented to person, place, and time.  Psychiatric:        Mood and Affect: Mood normal.        Behavior: Behavior normal.        Thought Content: Thought content normal.        Judgment: Judgment normal.      UC Treatments / Results  Labs (all labs ordered are listed, but only abnormal results are displayed) Labs Reviewed  URINALYSIS, ROUTINE W REFLEX MICROSCOPIC - Abnormal; Notable for the following components:      Result Value   APPearance CLOUDY (*)    Hgb urine dipstick LARGE (*)    Protein, ur TRACE (*)    Leukocytes,Ua  MODERATE (*)    All other components within normal limits  URINALYSIS, MICROSCOPIC (REFLEX) - Abnormal; Notable for the following components:   Bacteria, UA FEW (*)    All other components within normal limits  CERVICOVAGINAL ANCILLARY ONLY    EKG   Radiology No results found.  Procedures Procedures (including critical care time)  Medications Ordered in UC Medications - No data to display  Initial Impression / Assessment and Plan / UC Course  I have reviewed the triage vital signs and the nursing notes.  Pertinent labs & imaging results that were available during my care of the patient were reviewed by me and considered in my medical decision making (see chart for details).     Urinary tract infection with hematuria, site unspecified STI pending  Culture pending  Keflex 500 mg BID x 7 days  Hydration with water and cranberry juice  Final Clinical Impressions(s) / UC Diagnoses   Final diagnoses:  Urinary tract infection with hematuria, site unspecified     Discharge Instructions      Keflex 500 mg due to symptoms and history.  Urine culture pending STI testing pending  Encourage completion of treatment even when symptoms improve.  Discussed resistance with anbx overuse Discussed allergic reactions Encourage increasing hydration with water and how to tell when this is achieved Add cranberry juice 100% 8-16 ozs daily until symptoms improve Discussed hygiene  Avoid not voiding when the urge presents      ED Prescriptions     Medication Sig Dispense Auth. Provider   cephALEXin (KEFLEX) 500 MG capsule Take 1 capsule (500 mg total) by mouth 2 (two) times daily for 7 days. 14 capsule Barbette Merino, NP      PDMP not reviewed this encounter.   Thad Ranger [place], [place] 03/06/22 1029

## 2022-03-06 NOTE — Discharge Instructions (Addendum)
Inateied Keflex 500 mg due to symptoms and history.  Urine culture pending STI testing pending  Encourage completion of treatment even when symptoms improve.  Discussed resistance with anbx overuse Discussed allergic reactions Encourage increasing hydration with water and how to tell when this is achieved Add cranberry juice 100% 8-16 ozs daily until symptoms improve Discussed hygiene  Avoid not voiding when the urge presents

## 2022-03-06 NOTE — ED Triage Notes (Signed)
Pt c/o odorous urine, and an "off feeling" at the end of her urine stream. Started about 3 days ago. She was recently on abx for a UTI via virtual visit. She states she finished the abx about 5 days ago.

## 2022-03-09 ENCOUNTER — Telehealth (HOSPITAL_COMMUNITY): Payer: Self-pay | Admitting: Emergency Medicine

## 2022-03-09 LAB — CERVICOVAGINAL ANCILLARY ONLY
Bacterial Vaginitis (gardnerella): POSITIVE — AB
Candida Glabrata: NEGATIVE
Candida Vaginitis: POSITIVE — AB
Chlamydia: NEGATIVE
Comment: NEGATIVE
Comment: NEGATIVE
Comment: NEGATIVE
Comment: NEGATIVE
Comment: NEGATIVE
Comment: NORMAL
Neisseria Gonorrhea: NEGATIVE
Trichomonas: NEGATIVE

## 2022-03-09 LAB — URINE CULTURE: Culture: 80000 — AB

## 2022-03-09 MED ORDER — METRONIDAZOLE 500 MG PO TABS: 500.0000 mg | ORAL_TABLET | Freq: Two times a day (BID) | ORAL | 0 refills | Status: AC

## 2022-03-09 MED ORDER — FLUCONAZOLE 150 MG PO TABS: 150.0000 mg | ORAL_TABLET | Freq: Once | ORAL | 0 refills | Status: AC

## 2022-04-12 IMAGING — CR DG CHEST 2V
2 series · 2 of 2 positions shown · non-contrast
Comparison: None.

CLINICAL DATA: Cough, headache, loss of taste, initial COVID
testing negative, never received subsequent PCR results

EXAM:
CHEST - 2 VIEW

[chest pa]
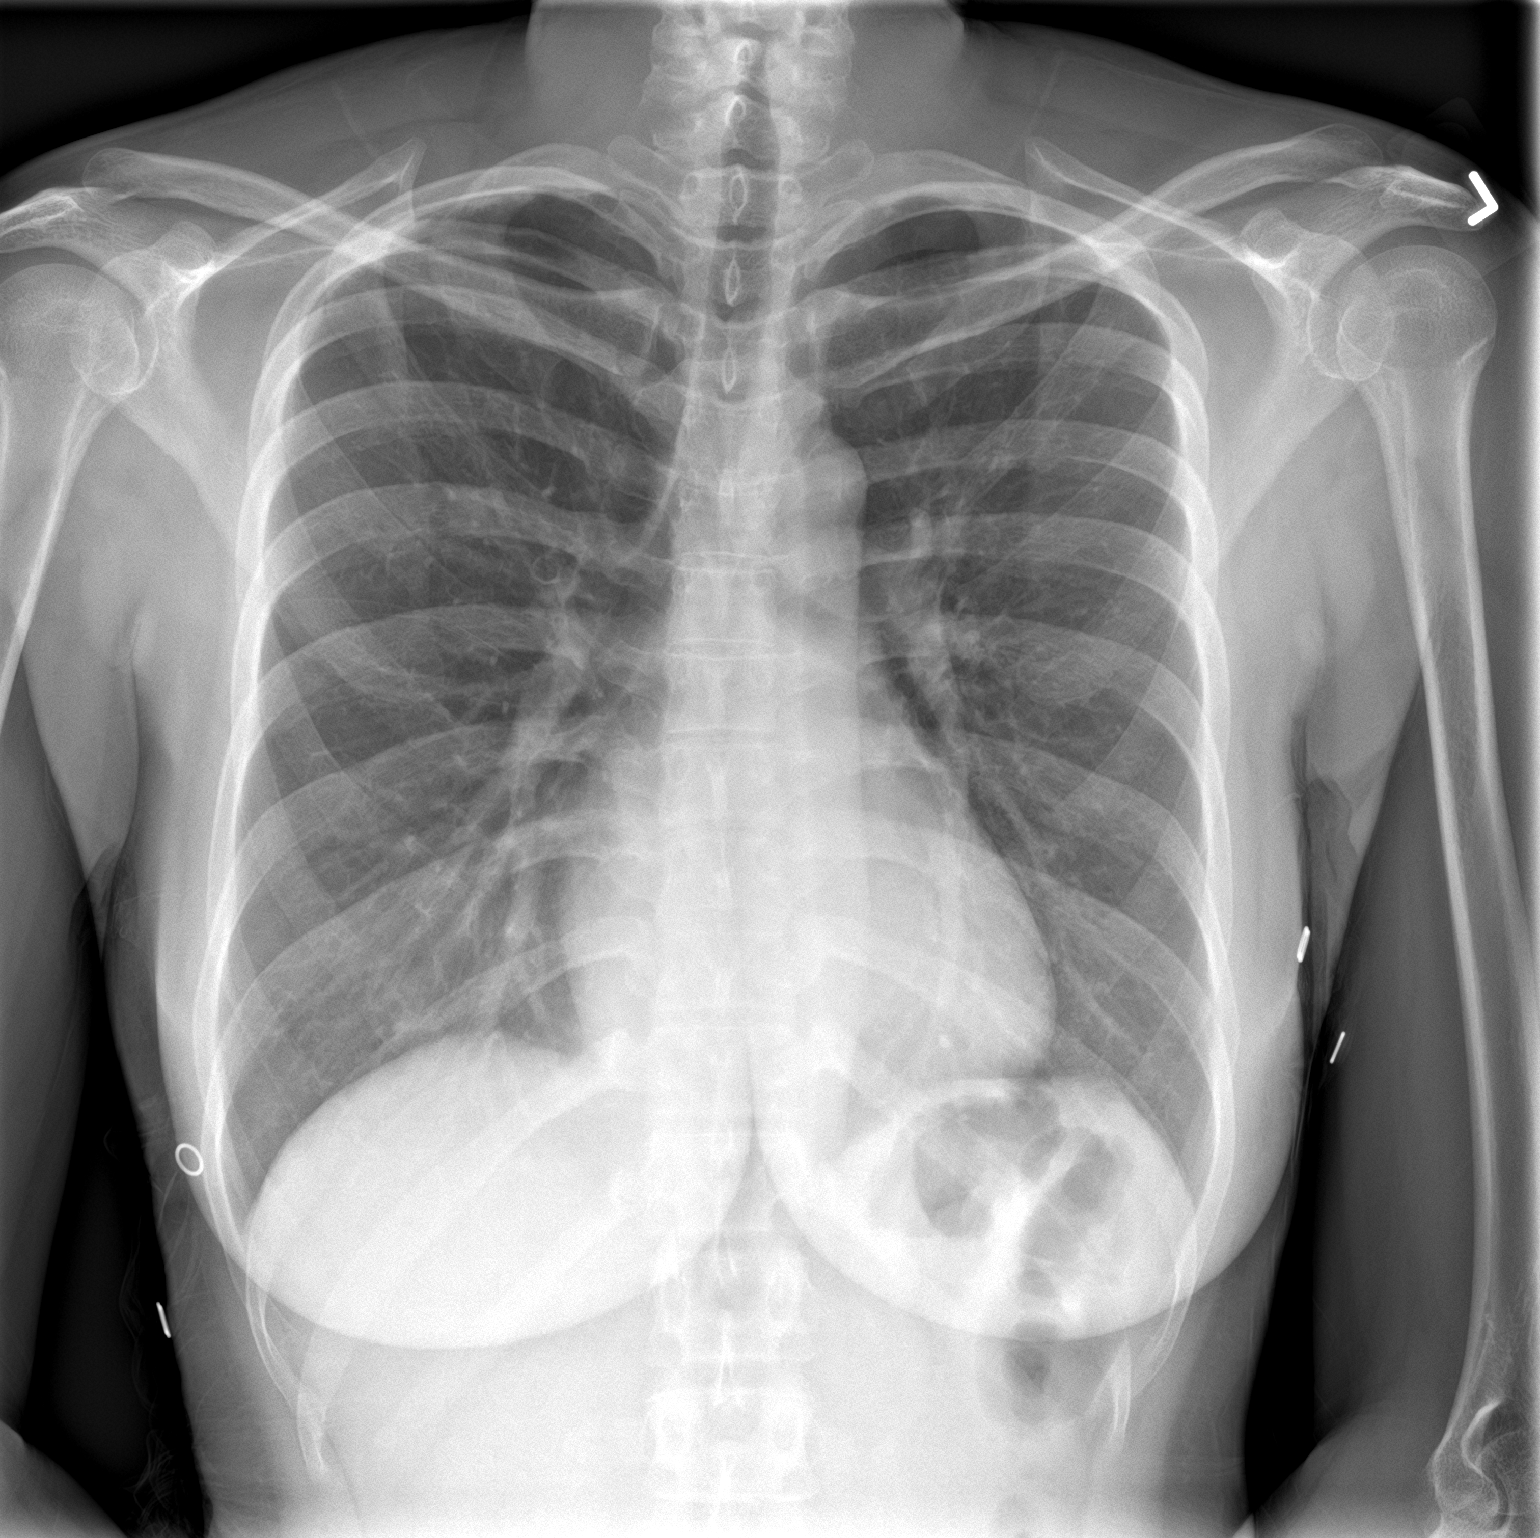

[chest lat]
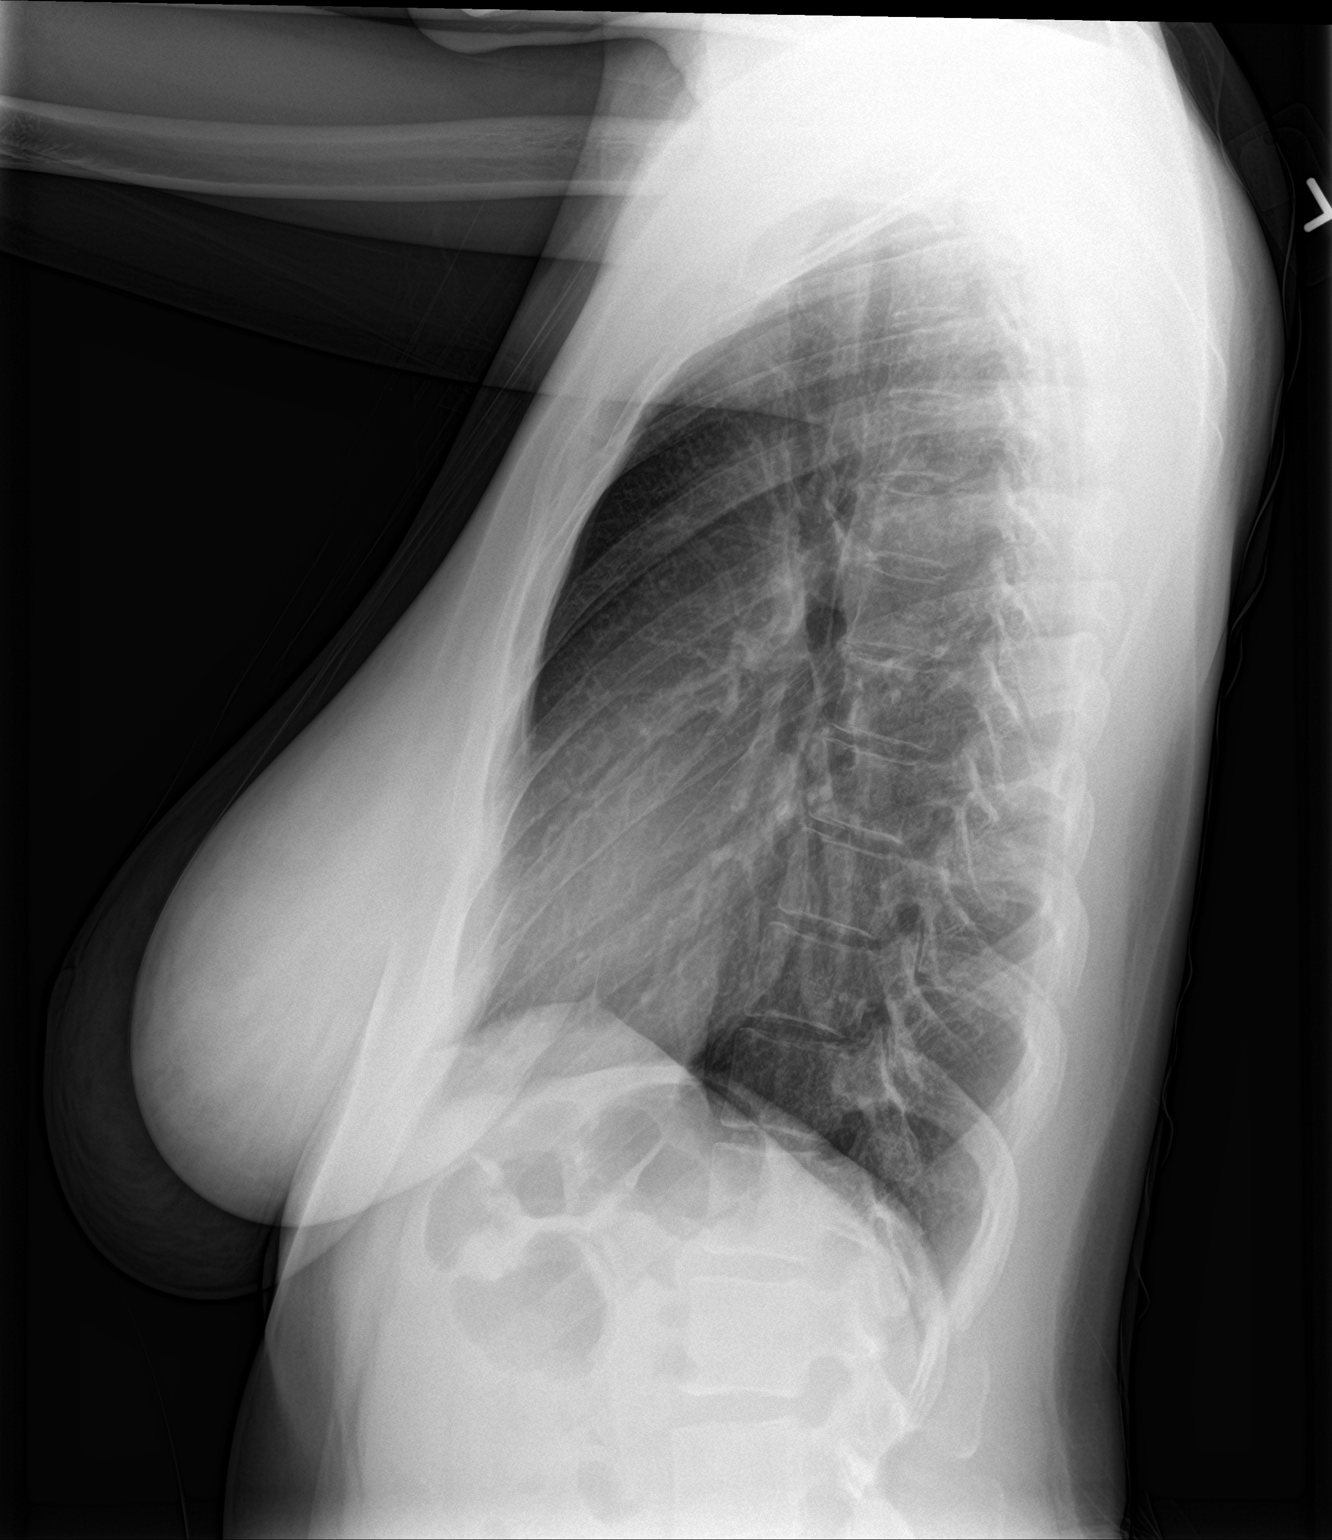

[2 of 2 positions shown; findings below may reference images not displayed]

FINDINGS: No consolidation, features of edema, pneumothorax, or effusion.
Pulmonary vascularity is normally distributed. The cardiomediastinal
contours are unremarkable. No acute osseous or soft tissue
abnormality.
IMPRESSION: No acute cardiopulmonary abnormality.
